# Patient Record
Sex: Male | Born: 1970 | Hispanic: Yes | Marital: Single | Smoking: Current every day smoker
Health system: Southern US, Community
[De-identification: ages and names within clinical notes are randomized; demographics above are authoritative.]

---

## 2009-03-13 ENCOUNTER — Inpatient Hospital Stay: Payer: Self-pay | Admitting: Student

## 2009-04-17 ENCOUNTER — Emergency Department: Payer: Self-pay | Admitting: Emergency Medicine

## 2009-05-01 ENCOUNTER — Emergency Department: Payer: Self-pay | Admitting: Internal Medicine

## 2009-05-17 ENCOUNTER — Emergency Department: Payer: Self-pay | Admitting: Unknown Physician Specialty

## 2009-09-29 ENCOUNTER — Emergency Department: Payer: Self-pay | Admitting: Emergency Medicine

## 2011-01-03 ENCOUNTER — Emergency Department: Payer: Self-pay | Admitting: Emergency Medicine

## 2011-05-06 IMAGING — CR DG SHOULDER 3+V*L*
1 series · 4 of 4 positions shown · non-contrast
Comparison: None

REASON FOR EXAM: pain
COMMENTS:

PROCEDURE:     DXR - DXR SHOULDER LEFT COMPLETE  - April 17, 2009  [DATE]
RESULT:     History: Pain

[Series 1: view not recorded · 0.17mm/px · 4 of 4 slices shown]
[im 1/4]
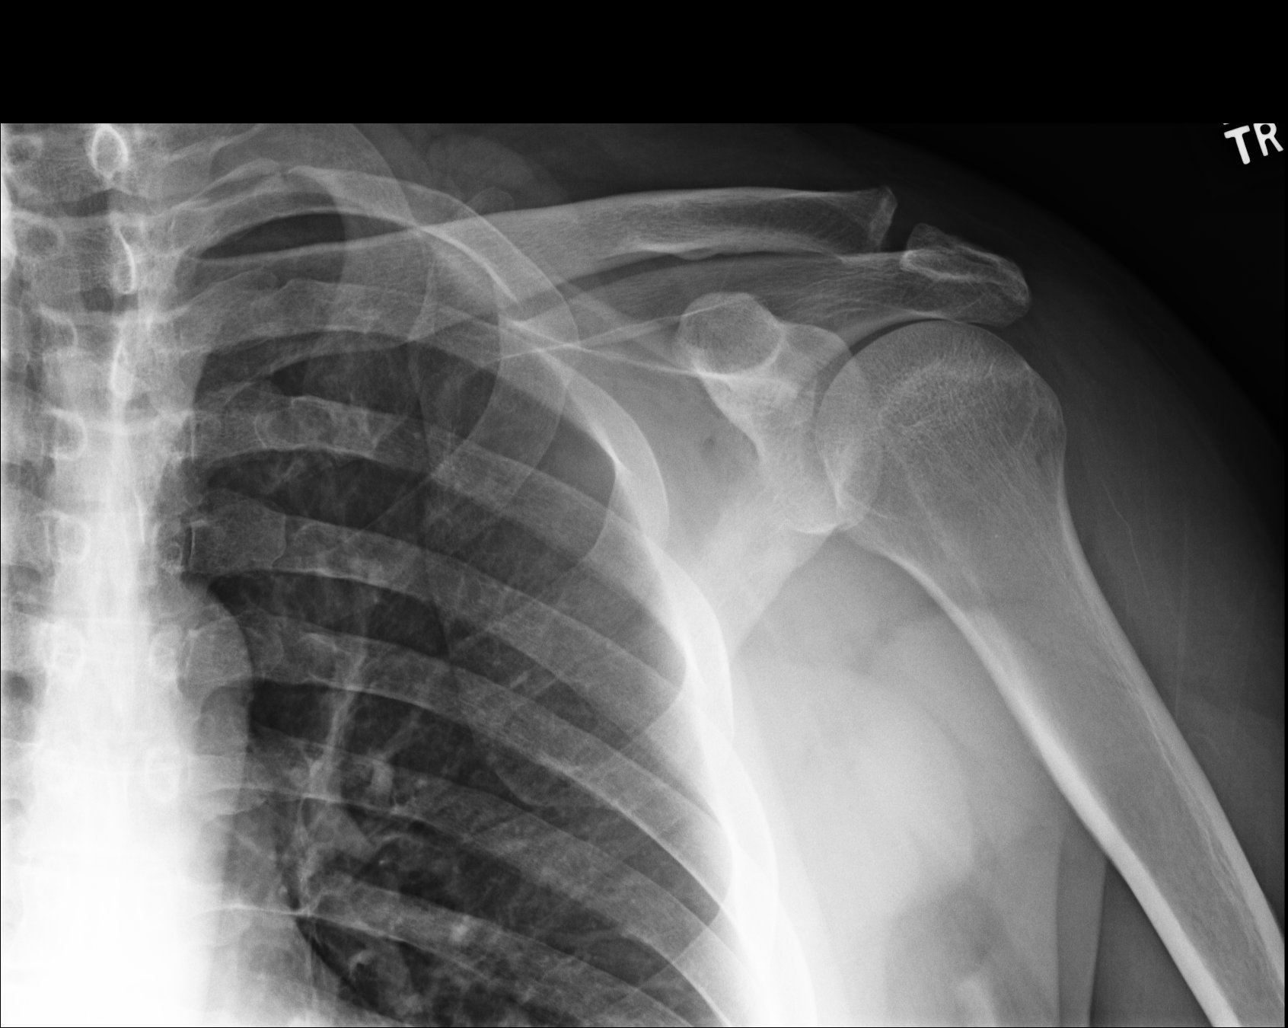
[im 2/4]
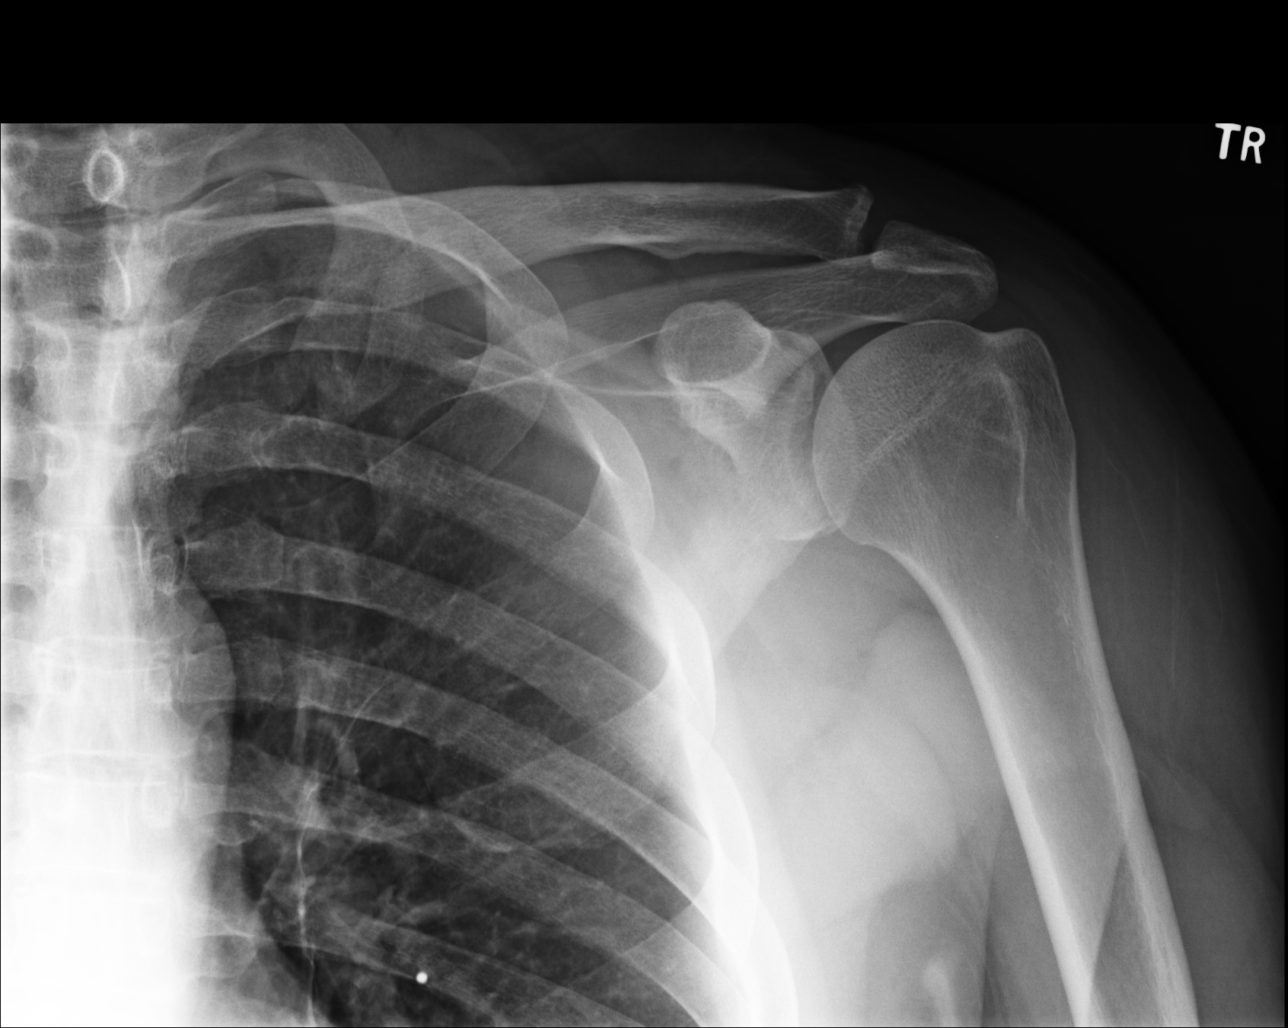
[im 3/4]
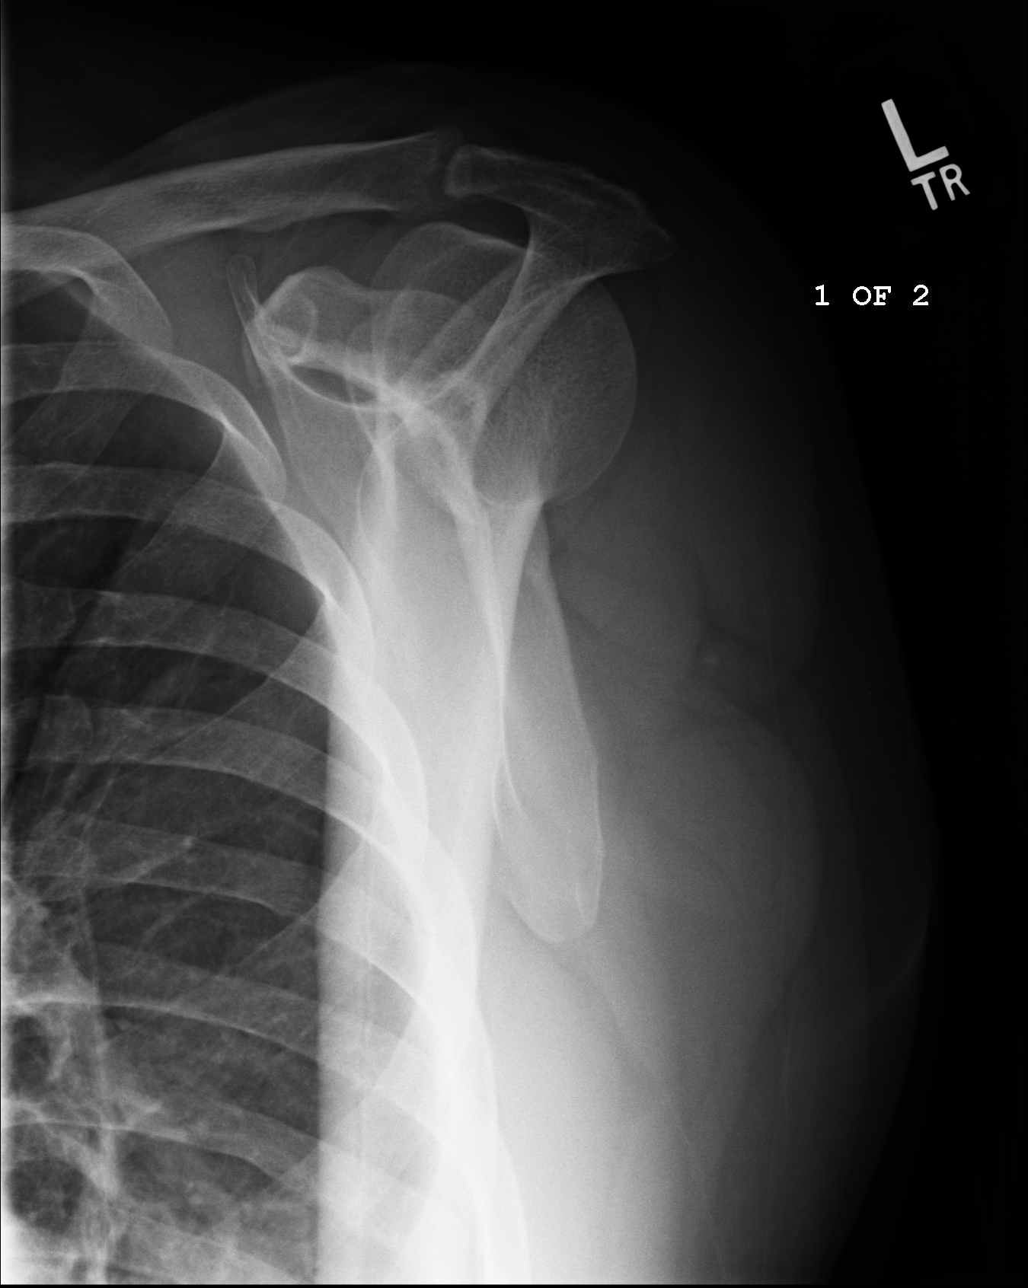
[im 4/4]
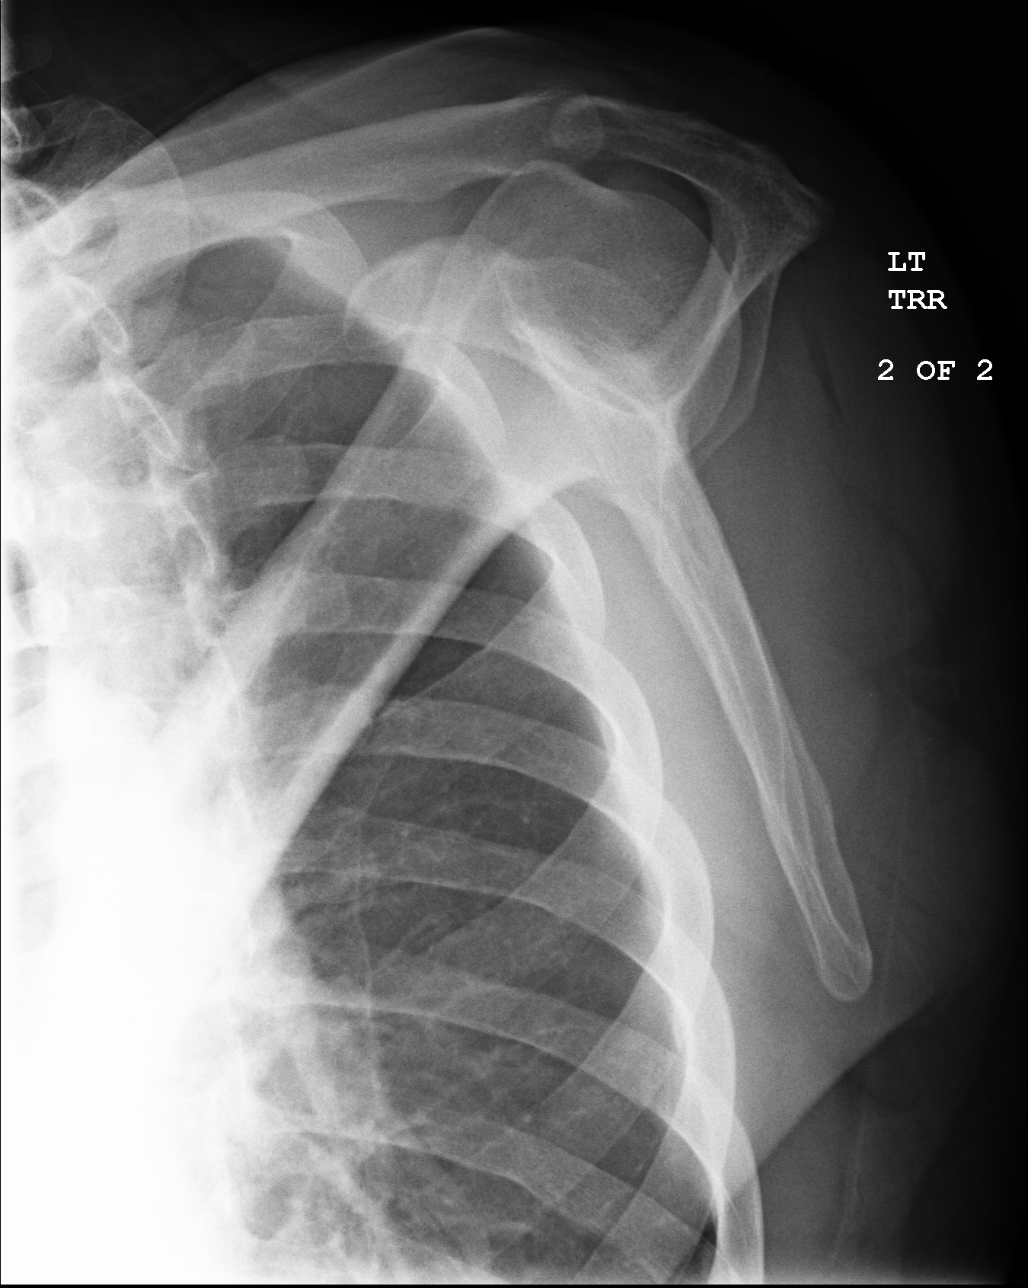

[4 of 4 positions shown; findings below may reference images not displayed]

FINDINGS: 4 views of the left shoulder demonstrates no fracture or dislocation. The
acromioclavicular joint is normal.
IMPRESSION: No acute osseous injury of the left shoulder.

## 2011-05-06 IMAGING — CR DG RIBS 2V*L*
1 series · 4 of 4 positions shown · non-contrast
Comparison: none

REASON FOR EXAM: pain
COMMENTS:

PROCEDURE:     DXR - DXR RIBS LEFT UNILATERAL  - April 17, 2009  [DATE]
RESULT:     Findings: 4 views of the left ribs demonstrates no rib fracture
or rib deformity. There is no pleural reaction.

[Series 1: view not recorded · 0.17mm/px · 4 of 4 slices shown]
[im 1/4]
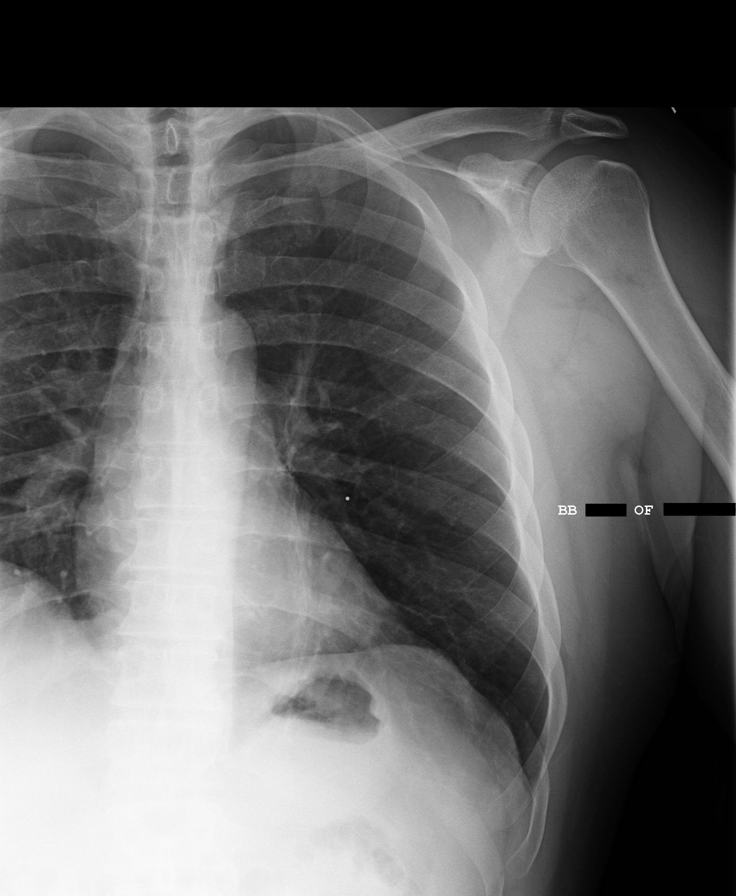
[im 2/4]
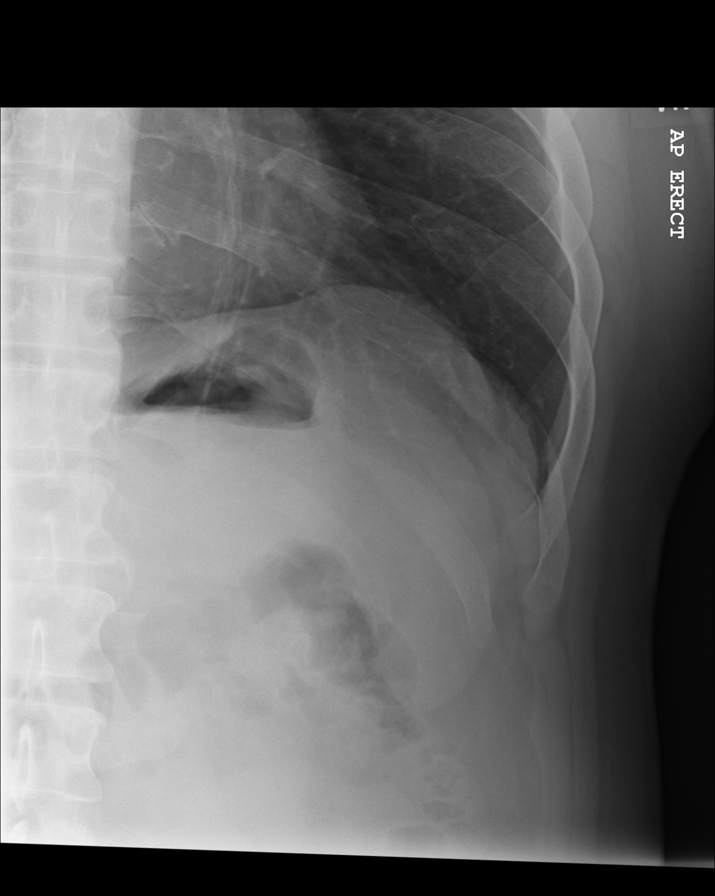
[im 3/4]
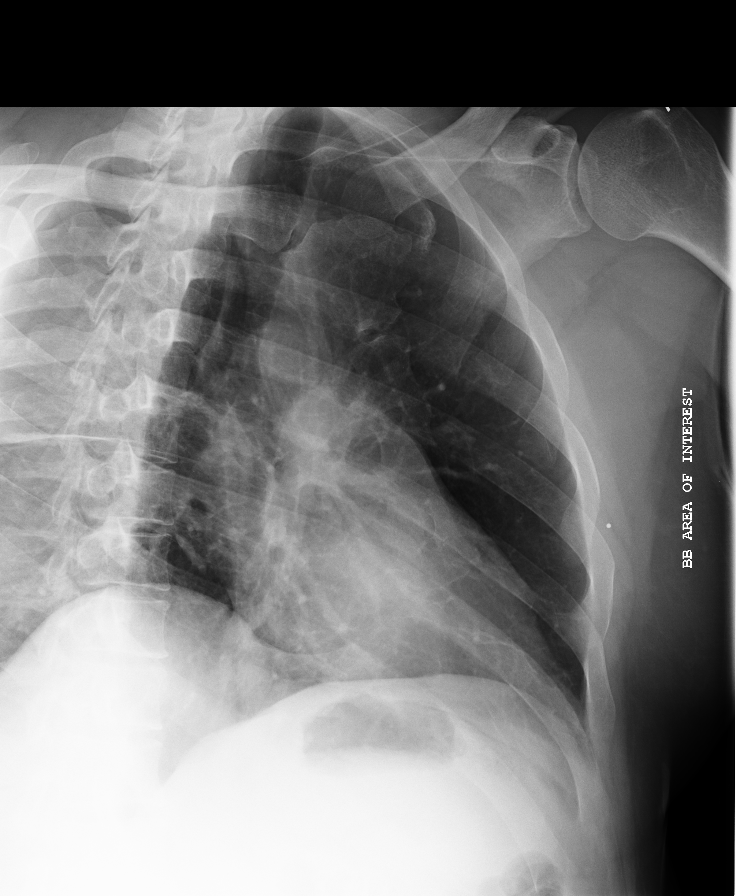
[im 4/4]
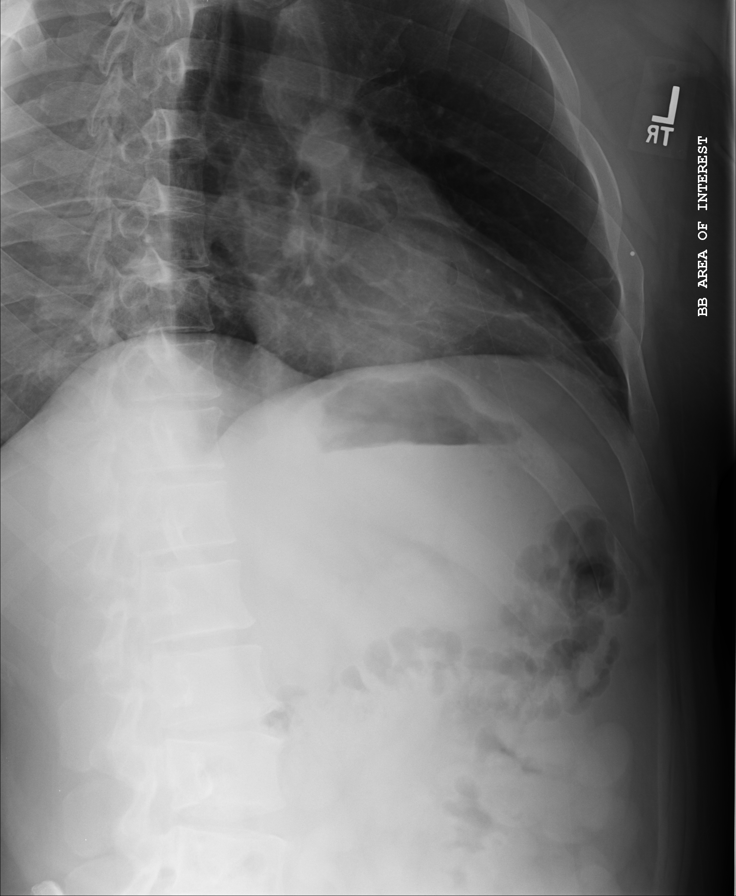

[4 of 4 positions shown; findings below may reference images not displayed]

IMPRESSION: No acute osseous injury of the left ribs.

## 2012-03-09 ENCOUNTER — Emergency Department: Payer: Self-pay | Admitting: Emergency Medicine

## 2012-04-13 ENCOUNTER — Inpatient Hospital Stay: Payer: Self-pay | Admitting: Internal Medicine

## 2012-04-13 LAB — BASIC METABOLIC PANEL
Anion Gap: 8 (ref 7–16)
Calcium, Total: 8.7 mg/dL (ref 8.5–10.1)
Co2: 26 mmol/L (ref 21–32)
EGFR (African American): >60
EGFR (Non-African Amer.): >60
Glucose: 117 mg/dL — ABNORMAL HIGH (ref 65–99)
Osmolality: 279 (ref 275–301)

## 2012-04-13 LAB — CBC
HCT: 39.6 % — ABNORMAL LOW (ref 40.0–52.0)
HGB: 13.6 g/dL (ref 13.0–18.0)
MCH: 29.8 pg (ref 26.0–34.0)
MCHC: 34.2 g/dL (ref 32.0–36.0)
MCV: 87 fL (ref 80–100)
RBC: 4.56 10*6/uL (ref 4.40–5.90)

## 2012-04-14 LAB — CBC WITH DIFFERENTIAL/PLATELET
Basophil #: 0.1 10*3/uL (ref 0.0–0.1)
Eosinophil %: 2.4 %
Lymphocyte #: 1.8 10*3/uL (ref 1.0–3.6)
Lymphocyte %: 10.3 %
MCHC: 34.4 g/dL (ref 32.0–36.0)
MCV: 87 fL (ref 80–100)
Monocyte %: 8.6 %
Neutrophil %: 77.9 %
Platelet: 210 10*3/uL (ref 150–440)
RBC: 4.1 10*6/uL — ABNORMAL LOW (ref 4.40–5.90)
RDW: 13.1 % (ref 11.5–14.5)
WBC: 17.7 10*3/uL — ABNORMAL HIGH (ref 3.8–10.6)

## 2012-04-15 LAB — CBC WITH DIFFERENTIAL/PLATELET
Basophil %: 0.2 %
Eosinophil #: 0 10*3/uL (ref 0.0–0.7)
HCT: 39.5 % — ABNORMAL LOW (ref 40.0–52.0)
HGB: 13.5 g/dL (ref 13.0–18.0)
Lymphocyte #: 1.2 10*3/uL (ref 1.0–3.6)
MCH: 29.9 pg (ref 26.0–34.0)
MCHC: 34.1 g/dL (ref 32.0–36.0)
MCV: 88 fL (ref 80–100)
Monocyte #: 0.4 x10 3/mm (ref 0.2–1.0)
Neutrophil #: 23.5 10*3/uL — ABNORMAL HIGH (ref 1.4–6.5)

## 2012-04-15 LAB — VANCOMYCIN, TROUGH: Vancomycin, Trough: 7 ug/mL — ABNORMAL LOW (ref 10–20)

## 2012-04-16 LAB — CBC WITH DIFFERENTIAL/PLATELET
Eosinophil: 1 %
HCT: 39.3 % — ABNORMAL LOW (ref 40.0–52.0)
HGB: 12.9 g/dL — ABNORMAL LOW (ref 13.0–18.0)
Lymphocytes: 10 %
MCH: 28.9 pg (ref 26.0–34.0)
MCHC: 32.9 g/dL (ref 32.0–36.0)
RBC: 4.48 10*6/uL (ref 4.40–5.90)
RDW: 13.3 % (ref 11.5–14.5)
Variant Lymphocyte - H1-Rlymph: 4 %
WBC: 23.4 10*3/uL — ABNORMAL HIGH (ref 3.8–10.6)

## 2012-04-16 LAB — CREATININE, SERUM: EGFR (African American): >60

## 2012-04-17 LAB — CBC WITH DIFFERENTIAL/PLATELET
Basophil #: 0.1 10*3/uL (ref 0.0–0.1)
Eosinophil #: 0.3 10*3/uL (ref 0.0–0.7)
HCT: 42.4 % (ref 40.0–52.0)
Lymphocyte #: 3.3 10*3/uL (ref 1.0–3.6)
Lymphocyte %: 20.1 %
MCH: 28.9 pg (ref 26.0–34.0)
MCHC: 33.2 g/dL (ref 32.0–36.0)
MCV: 87 fL (ref 80–100)
Monocyte %: 8.5 %
Neutrophil #: 11.2 10*3/uL — ABNORMAL HIGH (ref 1.4–6.5)
Neutrophil %: 68.6 %
Platelet: 336 10*3/uL (ref 150–440)
RDW: 13.3 % (ref 11.5–14.5)
WBC: 16.3 10*3/uL — ABNORMAL HIGH (ref 3.8–10.6)

## 2012-04-19 LAB — CULTURE, BLOOD (SINGLE)

## 2013-02-22 ENCOUNTER — Emergency Department: Payer: Self-pay | Admitting: Emergency Medicine

## 2013-02-22 LAB — URINALYSIS, COMPLETE
Bilirubin,UR: NEGATIVE
Blood: NEGATIVE
Leukocyte Esterase: NEGATIVE
Squamous Epithelial: <1
WBC UR: 1 /HPF (ref 0–5)

## 2013-08-29 ENCOUNTER — Emergency Department: Payer: Self-pay | Admitting: Emergency Medicine

## 2013-10-12 ENCOUNTER — Emergency Department: Payer: Self-pay | Admitting: Emergency Medicine

## 2014-09-04 ENCOUNTER — Emergency Department: Payer: Self-pay | Admitting: Emergency Medicine

## 2014-12-20 NOTE — H&P (Signed)
PATIENT NAME:  Alexander Reid, Alexander Reid MR#:  [id number] DATE OF BIRTH:  1971-05-19  DATE OF ADMISSION:  04/13/2012  PRIMARY CARE PHYSICIAN: Does not have one.   CHIEF COMPLAINT: Left lower extremity redness and swelling.   HISTORY OF PRESENT ILLNESS: This is a 44 year old male who presents to the hospital from prison due to significant left lower extremity swelling and redness. Patient noticed an open area on his left lower extremity about a few days ago. It was raised and blistering in nature. Patient was seen and evaluated by the nurse in the prison and was started on some p.o. Bactrim and some Tylenol for pain. Although over the past two days the area has progressively gotten more red and inflamed and is now going down his entire left lower extremity also into his inner thigh. He was then brought over to the Emergency Room. Patient is clinically diagnosed with left lower extremity cellulitis and having failed outpatient therapy is being admitted for IV antibiotics.   REVIEW OF SYSTEMS: CONSTITUTIONAL: Positive documented fever of 103. No weight gain. No weight loss. EYES: No blurred or double vision. ENT: No tinnitus. No postnasal drip. No redness of the oropharynx. RESPIRATORY: No cough, no wheeze, no hemoptysis, no dyspnea. CARDIOVASCULAR: No chest pain, no orthopnea, no palpitations, no syncope. GASTROINTESTINAL: No nausea, no vomiting, no diarrhea, no abdominal pain, no melena, no hematochezia. GENITOURINARY: No dysuria. No hematuria. ENDOCRINE: No polyuria or nocturia. No heat or cold intolerance. HEME: No anemia, no bruising, no bleeding. INTEGUMENTARY: No rashes. No lesions. MUSCULOSKELETAL: No arthritis, no swelling, no gout. NEUROLOGIC: No numbness, no tingling, no ataxia. No seizure-type activity. PSYCH: No anxiety, no insomnia, no ADD.   PAST MEDICAL HISTORY: He has no significant past medical history.   ALLERGIES: Ibuprofen, Tylenol and Vicodin.   SOCIAL HISTORY: No smoking. No alcohol abuse. No  illicit drug abuse.   FAMILY HISTORY: Patient's mother is still alive, has diabetes. He cannot recall any medical history about his father.   CURRENT MEDICATIONS:  1. Bactrim 1 tab b.i.d.  2. Tylenol 650 q.6 hours as needed.   PHYSICAL EXAMINATION:  VITAL SIGNS: Patient's vital signs are noted to be: Temperature 99.3, pulse 108, respirations 18, blood pressure 129/68, sats 98% on room air.   GENERAL: He is a pleasant appearing male in mild to moderate distress.   HEENT: Atraumatic, normocephalic. Extraocular muscles are intact. Pupils equal, reactive to light. Sclerae anicteric. No conjunctival injection. No pharyngeal erythema.   NECK: Supple. There is no jugular venous distention, no bruits, no lymphadenopathy, no thyromegaly.   HEART: Regular rate and rhythm. No murmurs, no rubs, no clicks.   LUNGS: Clear to auscultation bilaterally. No rales, no rhonchi, no wheezes.   ABDOMEN: Soft, flat, nontender, nondistended. Has good bowel sounds. No hepatosplenomegaly appreciated.   EXTREMITIES: No evidence of any cyanosis or clubbing. His left lower extremity there is a small area about 3 x 4 cm that is fluctuant and red and the redness and swelling is present throughout the entire left lower extremity consistent with cellulitis. +2 pedal and radial pulses. About +1 to 2 pitting edema on the left as compared to the right.   SKIN: Moist, warm with a cellulitic rash on the left lower extremity.   NEUROLOGIC: Patient is alert, awake, oriented x3 with no focal motor or sensory deficits appreciated bilaterally.   LYMPHATIC: There is no cervical or axillary lymphadenopathy.   LABORATORY, DIAGNOSTIC, AND RADIOLOGICAL DATA: Serum glucose 117, BUN 13, creatinine 0.9, sodium 139, potassium  3.8, chloride 105, bicarbonate 28, white cell count 21.3, hemoglobin 13.6, hematocrit 39.6, platelet count 234.   ASSESSMENT AND PLAN: This is a 44 year old male with no significant past medical history who  presents to the hospital from prison due to left lower extremity redness and swelling.  1. Left lower extremity cellulitis. This is the likely diagnosis given the patient's clinical presentation. Will start the patient on IV vancomycin and Zosyn. Patient apparently did say that he had a similar episode on his left upper extremity about a month to two months ago although it resolved with antibiotics. Will also continue to give him pain control with IV morphine. Continue supportive care. Will also get a surgical consult to see if the patient needs an incision and drainage given the small fluctuant area.  2. Leukocytosis. This is likely secondary to cellulitis. Will follow his white cell count with treatment with IV antibiotics.  3. CODE STATUS: Patient is a FULL CODE.   TIME SPENT ON ADMISSION: 45 minutes.   ____________________________ Rolly Pancake. Cherlynn Kaiser, MD vjs:cms D: 04/13/2012 18:36:00 ET T: 04/14/2012 07:11:48 ET JOB#: [id number]  cc: Rolly Pancake. Cherlynn Kaiser, MD, <Dictator> Houston Siren MD ELECTRONICALLY SIGNED 04/18/2012 10:48

## 2014-12-20 NOTE — Op Note (Signed)
PATIENT NAME:  Alexander StarchMAYA, Kadeen MR#:  [id number] DATE OF BIRTH:  Jun 22, 1971  DATE OF PROCEDURE:  04/14/2012  PREOPERATIVE DIAGNOSIS: Left anterior leg abscess.   POSTOPERATIVE DIAGNOSIS: Left anterior leg abscess.   PROCEDURE PERFORMED: Incision and drainage of left anterior leg abscess.   SURGEON:  Natale LayMark Drago Hammonds, MD   ASSISTANT: Wallace KellerLauren Weinberg, PA student.   TYPE OF ANESTHESIA: General with LMA.  ANESTHESIOLOGIST: Dr. [facility].   INDICATIONS: A 44 year old male presents to the hospital and was admitted to the Medical Service with a five-day history of left leg pain, cellulitis and swelling. Clinically there is a large abscess in the left anterior leg. There is extensive cellulitis. I discussed with him incision and drainage in the Operating Room. All of his questions are answered. Informed consent was obtained.   FINDINGS: There was 13 x 13 cm of cellulitis along the left anterior leg with a 6 x 7 cm subcutaneous abscess.   SPECIMENS: Pus.   ESTIMATED BLOOD LOSS: Minimal.   DRESSINGS: Iodoform gauze and ABD with tape and Kerlix.   DESCRIPTION OF PROCEDURE: With the patient in the supine position, general anesthesia was induced. The left leg was sterilely prepped and draped with ChloraPrep solution and a timeout was observed.   Utilizing an 18-gauge needle and 10 mL syringe, an aliquot of pus was aspirated and submitted for microbacteriological analysis.   An incision along the axial orientation of the limb was fashioned measuring approximately 1 cm over the area of maximal fluctuance into the abscess cavity. The abscess cavity was disrupted of its loculations with finger fracture technique, and the wound was then irrigated with sterile saline. With the abscess drained completely, it was then packed open with an inch-wide Iodoform gauze, followed by ABDs, Kerlix and tape. The patient was then returned to Anesthesia, extubated and taken to the PACU in stable and satisfactory  condition by Anesthesia Services.   ____________________________ Redge GainerMark A. Egbert GaribaldiBird, MD mab:cbb D: 04/14/2012 15:30:06 ET T: 04/14/2012 17:17:46 ET JOB#: [id number]  cc: Loraine LericheMark A. Egbert GaribaldiBird, MD, <Dictator>  Raynald KempMARK A Amador Braddy MD ELECTRONICALLY SIGNED 04/22/2012 15:03

## 2014-12-20 NOTE — Discharge Summary (Signed)
PATIENT NAME:  Alexander Reid, Iven MR#:  [id number] DATE OF BIRTH:  01-Nov-1970  DATE OF ADMISSION:  04/13/2012 DATE OF DISCHARGE:  04/17/2012  PRIMARY CARE PHYSICIAN: The patient is in the jail.  [facility]   DISCHARGE DIAGNOSES:   1. Systemic inflammatory response syndrome.  2. Abscess and cellulitis of the lower extremity.  3. Leg pain.   MEDICATIONS ON DISCHARGE:  1. Clindamycin 300 mg every 8 hours for 10 more days.  2. Oxycodone 5 mg, 1 tablet every 4 hours as needed for pain.  3. Wound packing with iodoform packing daily. Cover with abdominal pad and Kerlix wrap. The wound must remain covered while open.   DIET: Regular diet, regular consistency.   ACTIVITY: Activity as tolerated.   FOLLOWUP:  1. Follow-up with Surgery, Dr. Egbert GaribaldiBird, if needed.  2. [facility]   REASON FOR ADMISSION: The patient was admitted 04/13/2012 and discharged  04/17/2012. He came in with left lower extremity redness and swelling and was admitted with systemic inflammatory response syndrome and lower extremity abscess cellulitis, started on IV vancomycin and Zosyn. Surgical consultation initially was done by Dr. Excell Seltzerooper. The patient was taken to the Operating Room by Dr. Egbert GaribaldiBird on 04/14/2012.   HOSPITAL LABORATORY, DIAGNOSTIC AND RADIOLOGIC DATA: Blood cultures were negative for three days. First wound culture grew out rare staph aureus which was sensitive. Glucose 117, BUN 13, creatinine 0.95, sodium 139, potassium 3.8, chloride 105, CO2 26. White blood cell count 21.3, hemoglobin and hematocrit 13.6 and 39.6, platelet count 234; white count 17.7, hemoglobin and hematocrit 12.3 and 35.6, platelet count of 210. The second culture grew out heavy growth of staph aureus which was sensitive to clindamycin. White count upon discharge came down to 16.3.   The patient was afebrile, the wound was looking better, still packed, still some erythema surrounding  the drainage site.   HOSPITAL COURSE PER PROBLEM LIST:  1. Systemic inflammatory response syndrome: The patient's tachycardia had improved and now afebrile. White count was trending better down to 16,000. The abscess and cellulitis of the lower extremity was drained in the Operating Room by Dr. Egbert GaribaldiBird on 04/14/2012. It grew out staph aureus which was sensitive. The patient was switched over to clindamycin upon discharge and vancomycin was stopped.  2. Leg pain: He was given oxycodone for pain.   [facility]. The wound must be covered while in the jail until its healed. Follow-up with the doctor at the jail in 1 to 2 days for a relook. Can follow-up with Dr. Egbert GaribaldiBird as outpatient, if needed.   TIME SPENT ON DISCHARGE: 35 minutes.  ____________________________ Herschell Dimesichard J. Renae GlossWieting, MD rjw:cbb D: 04/17/2012 15:48:42 ET T: 04/18/2012 13:40:58 ET JOB#: [id number]  cc: Herschell Dimesichard J. Renae GlossWieting, MD, <Dictator> [facility] Salley ScarletICHARD J Aleister Lady MD ELECTRONICALLY SIGNED 04/29/2012 16:49

## 2014-12-20 NOTE — Consult Note (Signed)
PATIENT NAME:  Alexander Reid, Alexander Reid DATE OF BIRTH:  [birth year]  DATE OF CONSULTATION:  04/13/2012  REFERRING PHYSICIAN:   CONSULTING PHYSICIAN:  Adah Salvageichard E. Excell Seltzerooper, MD  CHIEF COMPLAINT: Left leg pain.   HISTORY OF PRESENT ILLNESS: This is a patient with several days of increasing redness and swelling of the left leg. He thinks he may have been bitten by a spider but did not see the spider and his pain and swelling emanates and radiates from a rounded area of small eschar measuring approximately 2 to 3 mm on the anterior surface of his leg on the left. He has considerable erythema as well. He has had no fevers or chills. He is currently incarcerated wearing a prison jumpsuit and shackles.   PAST MEDICAL HISTORY: None. Denies diabetes or any other medical problems.   PAST SURGICAL HISTORY: None.   ALLERGIES: Ibuprofen and Tylenol No. 3 and Vicodin.   MEDICATIONS: None in the prison or at home.   FAMILY HISTORY: Noncontributory.   SOCIAL HISTORY: He does not smoke. He is currently incarcerated.   REVIEW OF SYSTEMS: 10 system review was performed and negative with the exception of that mentioned in the history of present illness.   PHYSICAL EXAMINATION:  GENERAL: Healthy incarcerated prisoner wearing a prison jumpsuit with chains.   VITAL SIGNS: Temperature 99.3, pulse 108, respirations 18, blood pressure 129/68, 98% room air sat. Pain scale was 10 and now it is zero.   HEENT: No scleral icterus.   NECK: No palpable neck nodes.   CHEST: Clear to auscultation.   CARDIAC: Regular rate and rhythm.   ABDOMEN: Soft, nontender.   EXTREMITIES: Extremities are showing moderate edema and erythema of the left leg distal to the patella. There is a 2 mm area of eschar with blistering and fluctuance, minimally tender. The erythema stops at the ankle. No similar area on the right leg.   NEUROLOGIC: Grossly intact.   INTEGUMENT: No jaundice.   LABORATORY, DIAGNOSTIC, AND  RADIOLOGICAL DATA: White blood cell count 21.3, hemoglobin and hematocrit 13.6 and 39.6, platelet count 234. Electrolytes are within normal limits.   ASSESSMENT AND PLAN: This is a patient with left leg cellulitis and probable abscess with fluctuance anterior. The etiology of this is unclear. He is currently incarcerated and when I was interviewing him he had just finished a full meal. He requires incision and drainage of this area but because of his n.p.o. status he will need to be admitted to the hospital by Dr. Cherlynn KaiserSainani which has already been done. He has been placed on IV antibiotics. I talked to Dr. Egbert GaribaldiBird about scheduling him for tomorrow for incision and drainage of left leg abscess and he will be made n.p.o. after midnight and consented. I discussed with the patient the rationale for this plan and the risks of bleeding, infection, recurrence, and the need for an open wound and cosmetic deformity and recurrence. He understood and agreed to proceed.   ____________________________ Adah Salvageichard E. Excell Seltzerooper, MD rec:cms D: 04/13/2012 19:56:46 ET T: 04/14/2012 10:07:16 ET JOB#: [id number]  cc: Adah Salvageichard E. Excell Seltzerooper, MD, <Dictator> Lattie HawICHARD E Trentan Trippe MD ELECTRONICALLY SIGNED 04/14/2012 19:10

## 2014-12-20 NOTE — Consult Note (Signed)
Brief Consult Note: Diagnosis: left leg abscess.   Patient was seen by consultant.   Consult note dictated.   Recommend to proceed with surgery or procedure.   Orders entered.   Discussed with Attending MD.   Comments: Discussed with Drs. Sainani and Constellation BrandsBird. Needs I&D but pt just ate full meal. Will schedule for am. Risks discussed in detail.  Electronic Signatures: Lattie Hawooper, Yeiren Whitecotton E (MD)  (Signed 12-Aug-13 19:52)  Authored: Brief Consult Note   Last Updated: 12-Aug-13 19:52 by Lattie Hawooper, Cristabel Bicknell E (MD)

## 2015-07-11 ENCOUNTER — Encounter: Payer: Self-pay | Admitting: Emergency Medicine

## 2015-07-11 ENCOUNTER — Emergency Department
Admission: EM | Admit: 2015-07-11 | Discharge: 2015-07-11 | Disposition: A | Discharge status: Home / Self Care | Payer: Self-pay | Attending: Emergency Medicine | Admitting: Emergency Medicine

## 2015-07-11 ENCOUNTER — Emergency Department: Payer: Self-pay

## 2015-07-11 DIAGNOSIS — S0511XA Contusion of eyeball and orbital tissues, right eye, initial encounter: Secondary | ICD-10-CM

## 2015-07-11 DIAGNOSIS — S0011XA Contusion of right eyelid and periocular area, initial encounter: Secondary | ICD-10-CM | POA: Insufficient documentation

## 2015-07-11 DIAGNOSIS — W228XXA Striking against or struck by other objects, initial encounter: Secondary | ICD-10-CM | POA: Insufficient documentation

## 2015-07-11 DIAGNOSIS — Y9389 Activity, other specified: Secondary | ICD-10-CM | POA: Insufficient documentation

## 2015-07-11 DIAGNOSIS — Y9289 Other specified places as the place of occurrence of the external cause: Secondary | ICD-10-CM | POA: Insufficient documentation

## 2015-07-11 DIAGNOSIS — Z720 Tobacco use: Secondary | ICD-10-CM | POA: Insufficient documentation

## 2015-07-11 DIAGNOSIS — Y998 Other external cause status: Secondary | ICD-10-CM | POA: Insufficient documentation

## 2015-07-11 MED ORDER — IBUPROFEN 800 MG PO TABS
800.0000 mg | ORAL_TABLET | Freq: Once | ORAL | Status: AC
  Administered 2015-07-11: 800 mg via ORAL
  Filled 2015-07-11: qty 1

## 2015-07-11 MED ORDER — TRAMADOL HCL 50 MG PO TABS
50.0000 mg | ORAL_TABLET | Freq: Once | ORAL | Status: AC
  Administered 2015-07-11: 50 mg via ORAL
  Filled 2015-07-11: qty 1

## 2015-07-11 MED ORDER — IBUPROFEN 800 MG PO TABS: 800.0000 mg | ORAL_TABLET | Freq: Three times a day (TID) | ORAL | Status: DC | PRN

## 2015-07-11 MED ORDER — TRAMADOL HCL 50 MG PO TABS: 50.0000 mg | ORAL_TABLET | Freq: Four times a day (QID) | ORAL | Status: DC | PRN

## 2015-07-11 NOTE — ED Provider Notes (Signed)
[facility] Emergency Department Provider Note  ____________________________________________  Time seen: Approximately 5:48 PM  I have reviewed the triage vital signs and the nursing notes.   HISTORY  Chief Complaint Headache    HPI Alexander Reid. is a 44 y.o. male patient complained of headache, right periorbital pain, edema, decreased vision and ecchymosissecondary to blunt trauma 5 days ago. Patient was assisted in placing a sign when his partner lost control of it and was struck in the right periorbital area of his face. He denies any loss of consciousness. Patient state his headache is frontal. Patient states continue had blurry vision. Patient denies any vertigo. He denies any nausea vomiting secondary to his complaint. Patient rated his pain discomfort as 8/10. Patient taking only Tylenol for his complaint.   History reviewed. No pertinent past medical history.  There are no active problems to display for this patient.   History reviewed. No pertinent past surgical history.  Current Outpatient Rx  Name  Route  Sig  Dispense  Refill  . ibuprofen (ADVIL,MOTRIN) 800 MG tablet   Oral   Take 1 tablet (800 mg total) by mouth every 8 (eight) hours as needed for moderate pain.   15 tablet   0   . traMADol (ULTRAM) 50 MG tablet   Oral   Take 1 tablet (50 mg total) by mouth every 6 (six) hours as needed for moderate pain.   12 tablet   0     Allergies Review of patient's allergies indicates no known allergies.  History reviewed. No pertinent family history.  Social History Social History  Substance Use Topics  . Smoking status: Current Every Day Smoker  . Smokeless tobacco: None  . Alcohol Use: No    Review of Systems Constitutional: No fever/chills Eyes: No visual changes. Patient stated blurry vision right eye. ENT: No sore throat. Cardiovascular: Denies chest pain. Respiratory: Denies shortness of breath. Gastrointestinal: No  abdominal pain.  No nausea, no vomiting.  No diarrhea.  No constipation. Genitourinary: Negative for dysuria. Musculoskeletal: Negative for back pain. Skin: Negative for rash. Right periorbital edema and ecchymosis. Neurological: Positive for headaches, but denies focal weakness or numbness. 10-point ROS otherwise negative.  ____________________________________________   PHYSICAL EXAM:  VITAL SIGNS: ED Triage Vitals  Enc Vitals Group     BP 07/11/15 1739 125/83 mmHg     Pulse Rate 07/11/15 1739 76     Resp 07/11/15 1739 17     Temp 07/11/15 1739 98.1 F (36.7 C)     Temp Source 07/11/15 1739 Oral     SpO2 07/11/15 1739 95 %     Weight 07/11/15 1739 195 lb (88.451 kg)     Height 07/11/15 1739  (1.651 m)     Head Cir --      Peak Flow --      Pain Score 07/11/15 1725 8     Pain Loc --      Pain Edu? --      Excl. in GC? --     Constitutional: Alert and oriented. Well appearing and in no acute distress. Eyes: Conjunctivae are normal. PERRL. EOMI. Head: Atraumatic. Nose: No congestion/rhinnorhea. Mouth/Throat: Mucous membranes are moist.  Oropharynx non-erythematous. Neck: No stridor.  No cervical spine tenderness to palpation. Hematological/Lymphatic/Immunilogical: No cervical lymphadenopathy. Cardiovascular: Normal rate, regular rhythm. Grossly normal heart sounds.  Good peripheral circulation. Respiratory: Normal respiratory effort.  No retractions. Lungs CTAB. Gastrointestinal: Soft and nontender. No distention. No abdominal bruits. No  CVA tenderness. Musculoskeletal: No lower extremity tenderness nor edema.  No joint effusions. Neurologic:  Normal speech and language. No gross focal neurologic deficits are appreciated. No gait instability. Skin:  Skin is warm, dry and intact. No rash noted. Right upper orbital edema and ecchymosis. Psychiatric: Mood and affect are normal. Speech and behavior are normal.  ____________________________________________   LABS (all  labs ordered are listed, but only abnormal results are displayed)  Labs Reviewed - No data to display ____________________________________________  EKG   ____________________________________________  RADIOLOGY facial CT shows no obvious fracture. ____________________________________________   PROCEDURES  Procedure(s) performed: None  Critical Care performed: No  ____________________________________________   INITIAL IMPRESSION / ASSESSMENT AND PLAN / ED COURSE  Pertinent labs & imaging results that were available during my care of the patient were reviewed by me and considered in my medical decision making (see chart for details).  Right periorbital contusion. Discussed CT findings with patient. Advised patient to continue light duty and take Tylenol for headache and pain. ____________________________________________   FINAL CLINICAL IMPRESSION(S) / ED DIAGNOSES  Final diagnoses:  Periorbital contusion, right, initial encounter      Joni ReiningRonald K Smith, PA-C 07/11/15 1834  Sharman CheekPhillip Stafford, MD 07/15/15 [phone]

## 2015-07-11 NOTE — ED Notes (Signed)
Pt to ed with c/o headache since head being hit with a sign at work on Thursday.  Pt with bruising noted to pt right eye.

## 2015-07-11 NOTE — Discharge Instructions (Signed)
Advised to continue taking Tylenol for headache

## 2017-07-29 IMAGING — CT CT MAXILLOFACIAL W/O CM
1 series · 16 of 30 positions shown, 20 images · non-contrast
Comparison: None.

CLINICAL DATA: Headache after being hit with sign at work 5 days
ago. Bruising to right eye.

EXAM:
CT MAXILLOFACIAL WITHOUT CONTRAST
TECHNIQUE: Multidetector CT imaging of the maxillofacial structures was
performed. Multiplanar CT image reconstructions were also generated.
A small metallic BB was placed on the right temple in order to
reliably differentiate right from left.

[Series 2: max soft · axial · 0.37mm/px · z∈[+231,+387]mm · 16 of 84 slices shown, 20 images]
[im 3/84  brain]
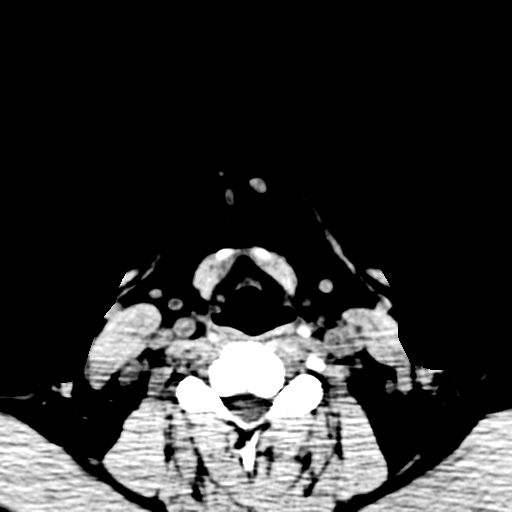
[im 3/84  bone]
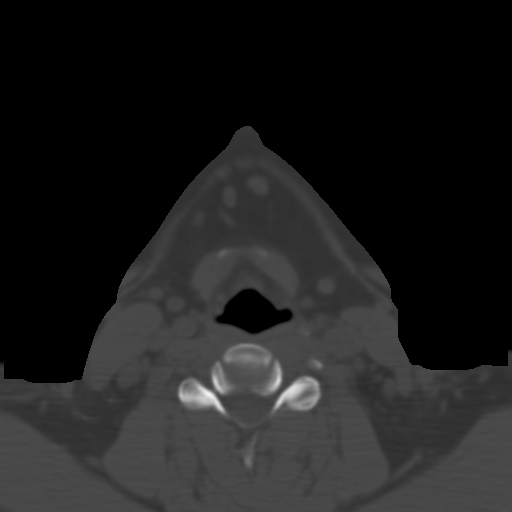
[im 9/84  bone]
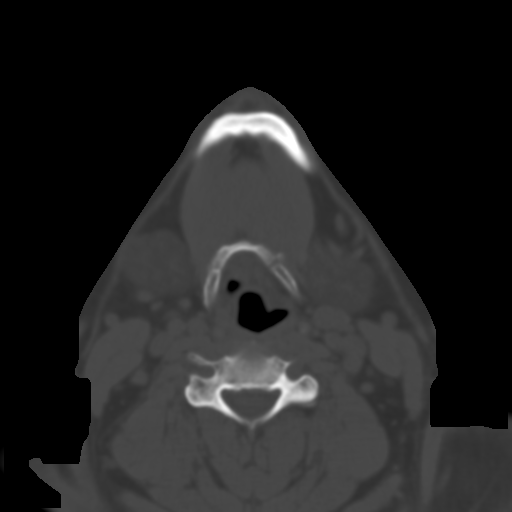
[im 15/84  bone]
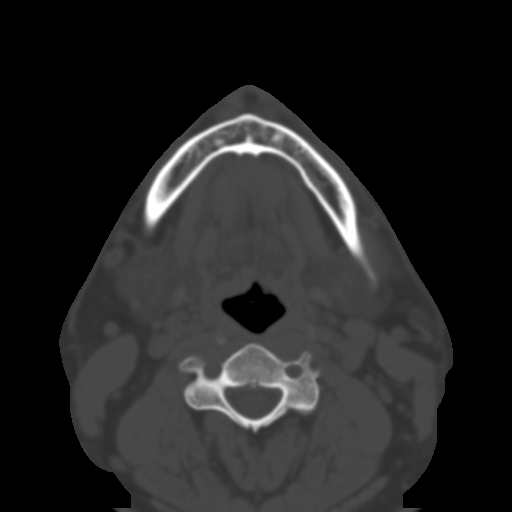
[im 21/84  bone]
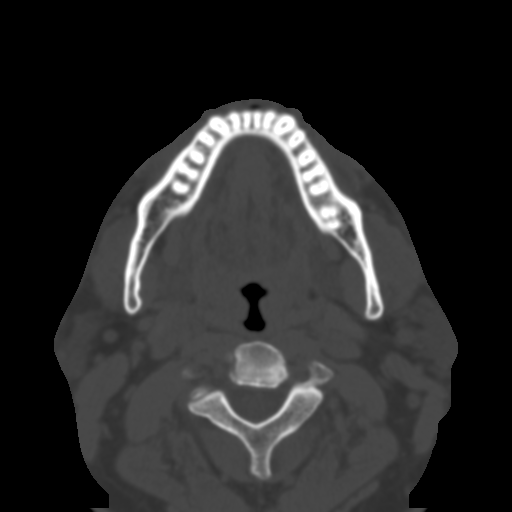
[im 23/84  brain]
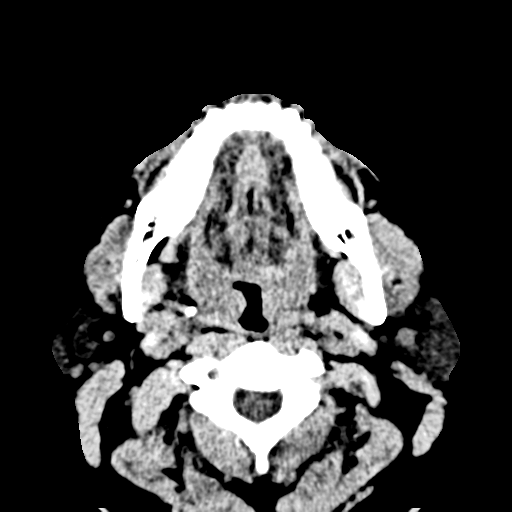
[im 23/84  bone]
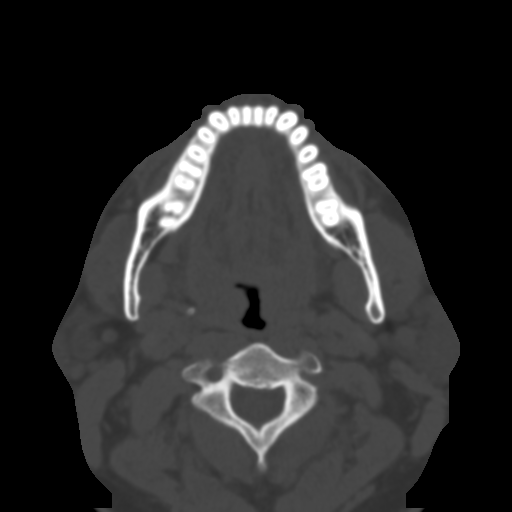
[im 29/84  bone]
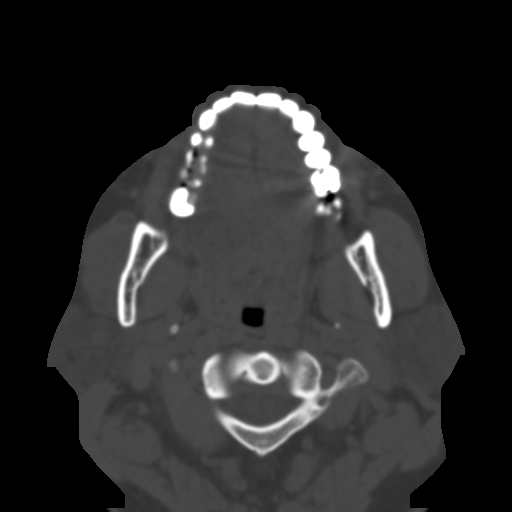
[im 35/84  bone]
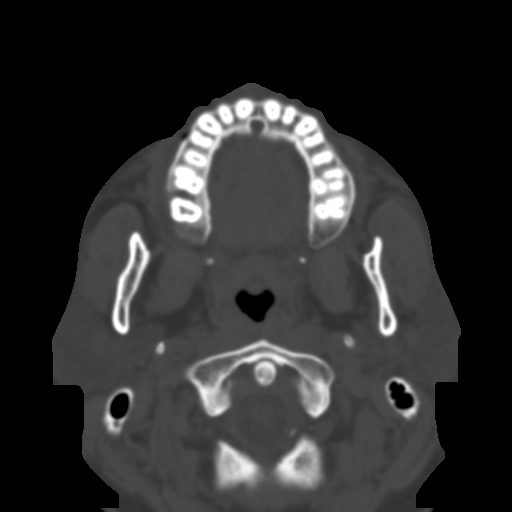
[im 41/84  bone]
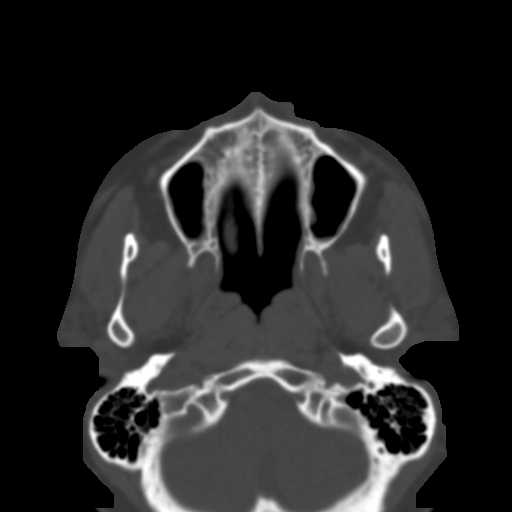
[im 43/84  brain]
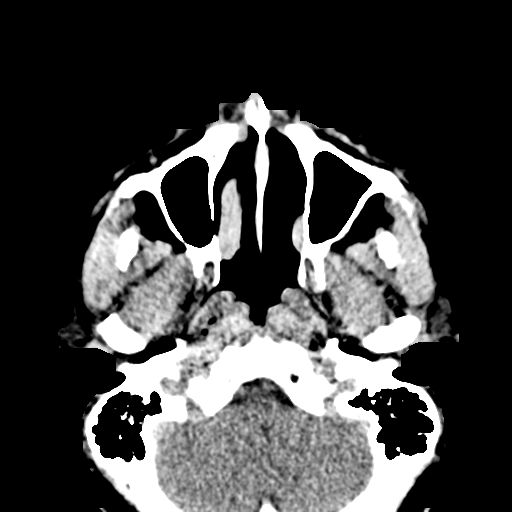
[im 43/84  bone]
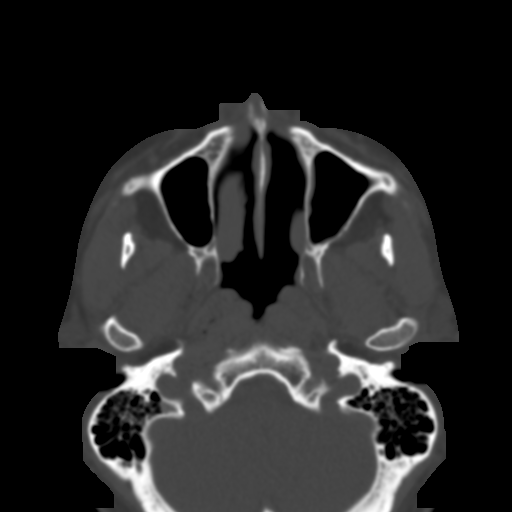
[im 49/84  bone]
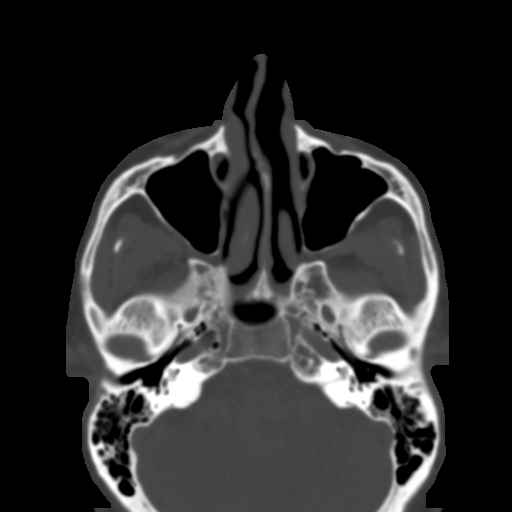
[im 55/84  bone]
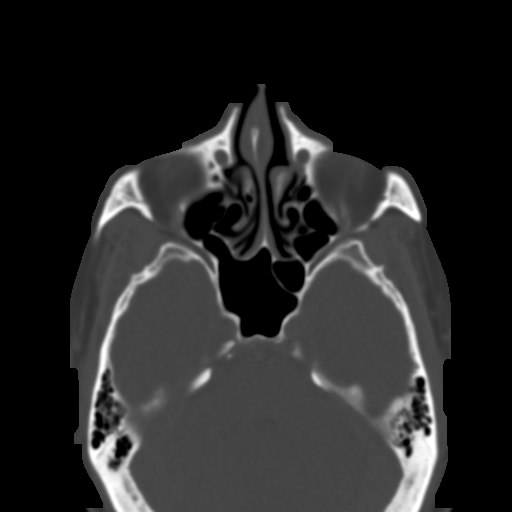
[im 61/84  bone]
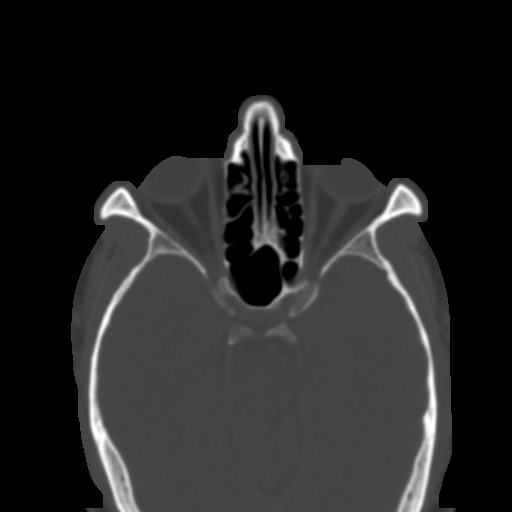
[im 63/84  brain]
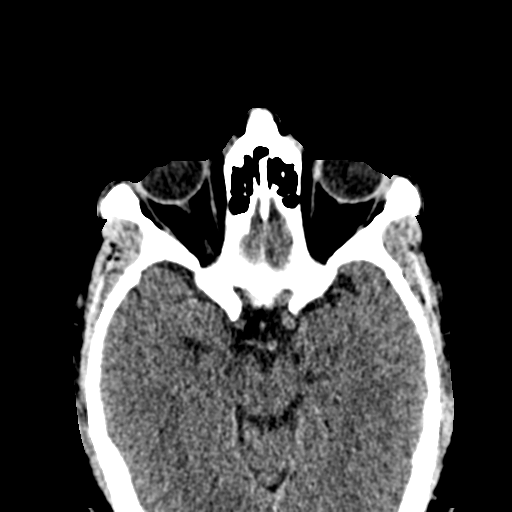
[im 63/84  bone]
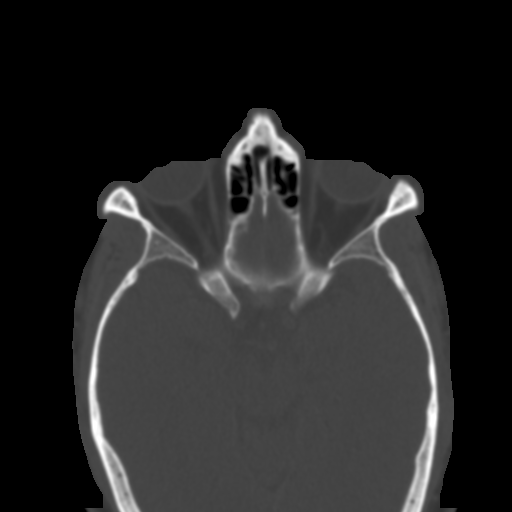
[im 69/84  bone]
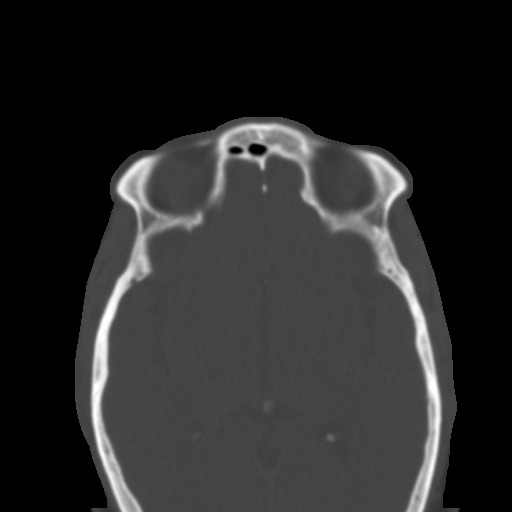
[im 75/84  bone]
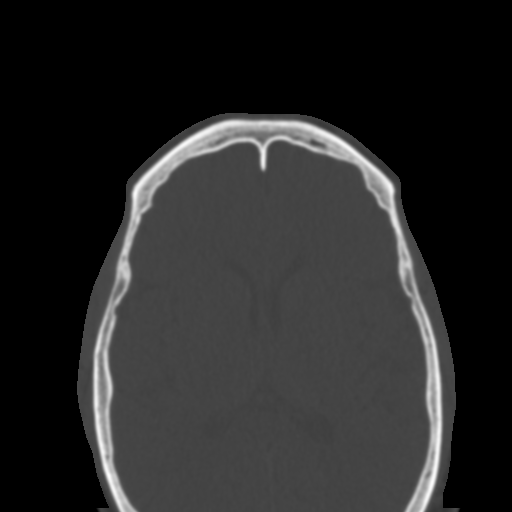
[im 81/84  bone]
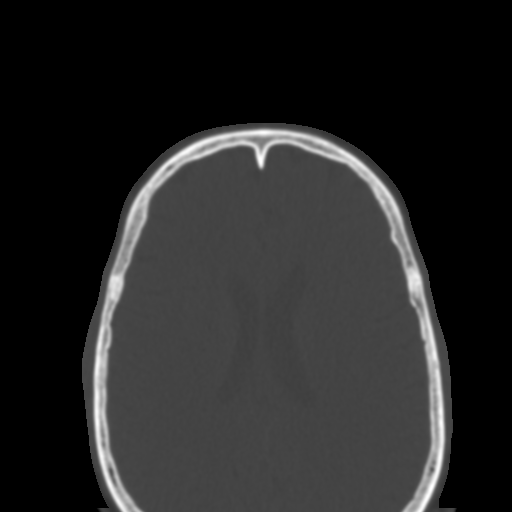

[16 of 30 positions shown; findings below may reference images not displayed]

FINDINGS: Visualized intracranial structures are normal. Orbits are normal and
symmetric. Paranasal sinuses are well developed and well aerated
without air-fluid levels. Mastoid air cells are clear. There is
minimal depression of the distal aspect of the nasal bone which may
be within normal versus subtle fracture. Otherwise, no evidence of
facial bone fracture. Few small calcifications over the
peritonsillar soft tissues bilaterally. Remaining bones and soft
tissues are within normal.
IMPRESSION: Subtle depression of the distal nasal bone which may be within
normal versus subtle fracture. Otherwise, no acute findings.

## 2018-10-08 ENCOUNTER — Encounter: Payer: Self-pay | Admitting: Emergency Medicine

## 2018-10-08 ENCOUNTER — Other Ambulatory Visit: Payer: Self-pay

## 2018-10-08 ENCOUNTER — Emergency Department
Admission: EM | Admit: 2018-10-08 | Discharge: 2018-10-08 | Disposition: A | Discharge status: Home / Self Care | Payer: Self-pay | Attending: Emergency Medicine | Admitting: Emergency Medicine

## 2018-10-08 DIAGNOSIS — K0889 Other specified disorders of teeth and supporting structures: Secondary | ICD-10-CM | POA: Insufficient documentation

## 2018-10-08 DIAGNOSIS — F17200 Nicotine dependence, unspecified, uncomplicated: Secondary | ICD-10-CM | POA: Insufficient documentation

## 2018-10-08 MED ORDER — AMOXICILLIN 500 MG PO CAPS: 500.0000 mg | ORAL_CAPSULE | Freq: Three times a day (TID) | ORAL | 0 refills | Status: DC

## 2018-10-08 NOTE — ED Triage Notes (Signed)
Pt states tooth pulled 2 weeks ago and has been having pain and was told to come and be checked and was told ok but pt wants checked out

## 2018-10-08 NOTE — Discharge Instructions (Addendum)
Follow up with [facility].  If you are unable to go to this clinic then call one of the following:  OPTIONS FOR DENTAL FOLLOW UP CARE  [place] Department of Health and Human Services - Local Safety Net Dental Clinics [email or web address]   [facility] [phone])  [facility] [phone])  [place] [phone] ext [phone])  [facility] [phone])  [facility] [phone]) This clinic caters to the indigent population and is on a lottery system. Location: [employer] [facility], [employer], [address], [place] Clinic Hours: Wednesdays from 6pm - 9pm, patients seen by a lottery system. For dates, call or go to [email or web address] Services: Cleanings, fillings and simple extractions. Payment Options: DENTAL WORK IS FREE OF CHARGE. Bring proof of income or support. Best way to get seen: Arrive at 5:15 pm - this is a lottery, NOT first come/first serve, so arriving earlier will not increase your chances of being seen.     [facility] [phone] Select option 1 for emergencies   Location: [facility], [place], [address] [place] Clinic Hours: No walk-ins accepted - call the day before to schedule an appointment. Check in times are 9:30 am and 1:30 pm. Services: Simple extractions, temporary fillings, pulpectomy/pulp debridement, uncomplicated abscess drainage. Payment Options: PAYMENT IS DUE AT THE TIME OF SERVICE.  Fee is usually $100-200, additional surgical procedures (e.g. abscess drainage) may be extra. Cash, checks, Visa/MasterCard accepted.  Can file Medicaid if patient is covered for dental - patient should call case worker to check. No discount for [facility] patients. Best way to get seen: MUST call the day before and get onto the schedule. Can  usually be seen the next 1-2 days. No walk-ins accepted.     [facility] [phone]   Location: [facility], [address] [place] Clinic Hours: M, W, Th, F 8am or 1:30pm, Tues 9a or 1:30 - first come/first served. Services: Simple extractions, temporary fillings, uncomplicated abscess drainage.  You do not need to be an [facility] resident. Payment Options: PAYMENT IS DUE AT THE TIME OF SERVICE. Dental insurance, otherwise sliding scale - bring proof of income or support. Depending on income and treatment needed, cost is usually $50-200. Best way to get seen: Arrive early as it is first come/first served.     [facility] [phone]   Location: [address] Clinic Hours: Mon-Thu 8a-5p Services: Most basic dental services including extractions and fillings. Payment Options: PAYMENT IS DUE AT THE TIME OF SERVICE. Sliding scale, up to 50% off - bring proof if income or support. Medicaid with dental option accepted. Best way to get seen: Call to schedule an appointment, can usually be seen within 2 weeks OR they will try to see walk-ins - show up at 8a or 2p (you may have to wait).     [facility] [phone] [place] RESIDENTS ONLY   Location: [facility], [address], [place], [place] [address] Clinic Hours: By appointment only. Monday - Thursday 8am-5pm, Friday 8am-12pm Services: Cleanings, fillings, extractions. Payment Options: PAYMENT IS DUE AT THE TIME OF SERVICE. Cash, Visa or MasterCard. Sliding scale - $30 minimum per service. Best way to get seen: Come in to office, complete packet and make an appointment - need proof of income or support monies for each household member and proof of [facility] residence.  Usually takes about a month to get in.     [facility] [phone]   Location: [address] [facility] Clinic Hours: Walk-in Urgent Care Dental Services are offered Monday-Friday mornings only. The numbers of emergencies accepted daily is limited to the number of providers available. Maximum 15 - Mondays, Wednesdays & Thursdays Maximum 10 - Tuesdays & Fridays Services: You do not need to be a [facility] resident to be seen for a dental emergency. Emergencies are defined as pain, swelling, abnormal bleeding, or dental trauma. Walkins will receive x-rays if needed. NOTE: Dental cleaning is not an emergency. Payment Options: PAYMENT IS DUE AT THE TIME OF SERVICE. Minimum co-pay is $40.00 for uninsured patients. Minimum co-pay is $3.00 for Medicaid with dental coverage. Dental Insurance is accepted and must be presented at time of visit. Medicare does not cover dental. Forms of payment: Cash, credit card, checks. Best way to get seen: If not previously registered with the clinic, walk-in dental registration begins at 7:15 am and is on a first come/first serve basis. If previously registered with the clinic, call to make an appointment.     [facility] [phone] [place] RESIDENTS ONLY   Location: [address], [place], [place] Clinic Hours: Mon-Thu 10a-2p Services: Extractions only! Payment Options: FREE (donations accepted) - bring proof of income or support Best way to get seen: Call and schedule an appointment OR come at 8am on the 1st Monday of every month (except for holidays) when it is first come/first served.     [facility] [phone]   Location: [address] [place], [place] Clinic Hours: Friday mornings Services, Payment Options, Best way to get seen: Call for info

## 2018-10-08 NOTE — ED Provider Notes (Signed)
[facility] Emergency Department Provider Note  ____________________________________________   First MD Initiated Contact with Patient 10/08/18 1627     (approximate)  I have reviewed the triage vital signs and the nursing notes.   HISTORY  Chief Complaint Dental Pain    HPI Alexander Reid. is a 48 y.o. male presents emergency department complaining of fever and body aches.  He states he had a fever in his to sleep.  Still hurts.  He states he had a tooth pulled 2 weeks ago.  Has been having pain since then.  Thinks broke tooth still there.  He states he went to some kind that works out of a trailer on [address].  Is not sure the dentist name.  I asked him as he is sure this was a Arboriculturist and he said he was not really sure you baby guy $200 to pull his tooth.  He denies  any chest pain or shortness of breath.   History reviewed. No pertinent past medical history.  There are no active problems to display for this patient.   History reviewed. No pertinent surgical history.  Prior to Admission medications   Medication Sig Start Date End Date Taking? Authorizing Provider  amoxicillin (AMOXIL) 500 MG capsule Take 1 capsule (500 mg total) by mouth 3 (three) times daily. 10/08/18   Faythe Ghee, PA-C    Allergies Patient has no known allergies.  History reviewed. No pertinent family history.  Social History Social History   Tobacco Use  . Smoking status: Current Every Day Smoker  . Smokeless tobacco: Never Used  Substance Use Topics  . Alcohol use: No  . Drug use: No    Review of Systems  Constitutional: Positive fever/chills Eyes: No visual changes. ENT: No sore throat.  Positive dental pain Respiratory: Denies cough Genitourinary: Negative for dysuria. Musculoskeletal: Negative for back pain. Skin: Negative for rash.    ____________________________________________   PHYSICAL EXAM:  VITAL SIGNS: ED Triage Vitals  Enc  Vitals Group     BP 10/08/18 1607 120/73     Pulse Rate 10/08/18 1607 78     Resp 10/08/18 1607 16     Temp 10/08/18 1607 98 F (36.7 C)     Temp src --      SpO2 10/08/18 1607 98 %     Weight 10/08/18 1608 185 lb (83.9 kg)     Height 10/08/18 1608 5\' 5"  (1.651 m)     Head Circumference --      Peak Flow --      Pain Score 10/08/18 1608 8     Pain Loc --      Pain Edu? --      Excl. in GC? --     Constitutional: Alert and oriented. Well appearing and in no acute distress. Eyes: Conjunctivae are normal.  Head: Atraumatic. Nose: No congestion/rhinnorhea. Mouth/Throat: Mucous membranes are moist.  Positive for recently pulled tooth noted on the left upper aspect.  Questionable remaining parts of the tooth are noted. Neck:  supple no lymphadenopathy noted Cardiovascular: Normal rate, regular rhythm. Heart sounds are normal Respiratory: Normal respiratory effort.  No retractions, lungs c t a  GU: deferred Musculoskeletal: FROM all extremities, warm and well perfused Neurologic:  Normal speech and language.  Skin:  Skin is warm, dry and intact. No rash noted. Psychiatric: Mood and affect are normal. Speech and behavior are normal.  ____________________________________________   LABS (all labs ordered are listed, but  only abnormal results are displayed)  Labs Reviewed - No data to display ____________________________________________   ____________________________________________  RADIOLOGY    ____________________________________________   PROCEDURES  Procedure(s) performed: No  Procedures    ____________________________________________   INITIAL IMPRESSION / ASSESSMENT AND PLAN / ED COURSE  Pertinent labs & imaging results that were available during my care of the patient were reviewed by me and considered in my medical decision making (see chart for details).   Patient is 48 year old male presents emergency department complaining of fever and continued  dental pain after having tooth pulled.  Physical exam shows that the area of the extraction is red and swollen with questionable parts of a tooth.  Explained findings to the patient.  He was given prescription for amoxicillin.  He is to follow-up with [facility] dental clinic for evaluation.  He states he understands will comply.  He is to return if worsening.  He was discharged in stable condition.     As part of my medical decision making, I reviewed the following data within the electronic MEDICAL RECORD NUMBER Nursing notes reviewed and incorporated, Old chart reviewed, Notes from prior ED visits and [place] Controlled Substance Database  ____________________________________________   FINAL CLINICAL IMPRESSION(S) / ED DIAGNOSES  Final diagnoses:  Pain, dental      NEW MEDICATIONS STARTED DURING THIS VISIT:  New Prescriptions   AMOXICILLIN (AMOXIL) 500 MG CAPSULE    Take 1 capsule (500 mg total) by mouth 3 (three) times daily.     Note:  This document was prepared using Dragon voice recognition software and may include unintentional dictation errors.    Faythe Ghee, PA-C 10/08/18 1911    Dionne Bucy, MD 10/08/18 [phone]

## 2018-10-08 NOTE — ED Notes (Signed)
See triage note  Presents intermittent fever and body aches  States he had a tooth pulled about 2 weeks ago  States he is having headache along with fever    States fever at home was 102 but is afebrile on arrival

## 2018-11-16 ENCOUNTER — Encounter: Payer: Self-pay | Admitting: *Deleted

## 2018-11-16 ENCOUNTER — Emergency Department
Admission: EM | Admit: 2018-11-16 | Discharge: 2018-11-16 | Disposition: A | Discharge status: Home / Self Care | Payer: Self-pay | Attending: Emergency Medicine | Admitting: Emergency Medicine

## 2018-11-16 ENCOUNTER — Other Ambulatory Visit: Payer: Self-pay

## 2018-11-16 DIAGNOSIS — J111 Influenza due to unidentified influenza virus with other respiratory manifestations: Secondary | ICD-10-CM | POA: Insufficient documentation

## 2018-11-16 DIAGNOSIS — R69 Illness, unspecified: Secondary | ICD-10-CM

## 2018-11-16 DIAGNOSIS — F17200 Nicotine dependence, unspecified, uncomplicated: Secondary | ICD-10-CM | POA: Insufficient documentation

## 2018-11-16 MED ORDER — PROMETHAZINE-DM 6.25-15 MG/5ML PO SYRP: 5.0000 mL | ORAL_SOLUTION | Freq: Four times a day (QID) | ORAL | 0 refills | Status: DC | PRN

## 2018-11-16 MED ORDER — IBUPROFEN 600 MG PO TABS: 600.0000 mg | ORAL_TABLET | Freq: Three times a day (TID) | ORAL | 0 refills | Status: DC | PRN

## 2018-11-16 NOTE — ED Triage Notes (Signed)
Says aching back, head, feels weak and fever on and off for 6 days.

## 2018-11-16 NOTE — ED Provider Notes (Signed)
[facility] Emergency Department Provider Note   ____________________________________________   First MD Initiated Contact with Patient 11/16/18 1324     (approximate)  I have reviewed the triage vital signs and the nursing notes.   HISTORY  Chief Complaint Influenza    HPI Alexander Tester. is a 48 y.o. male patient presents with headache and body for 6 days.  Patient also state nasal congestion but denies cough.  Patient state unsure of fever but feels weak.  Patient rates his pain as a 10/10.  Patient describes pain as" generalized aching".  Patient started has not taken flu shot for this season.  Patient requests test for coronavirus 19.  Patient denies recent travel.  Patient denies any contact with anyone with history of recent travel.  No palliative measures for complaint.     History reviewed. No pertinent past medical history.  There are no active problems to display for this patient.   History reviewed. No pertinent surgical history.  Prior to Admission medications   Medication Sig Start Date End Date Taking? Authorizing Provider  ibuprofen (ADVIL,MOTRIN) 600 MG tablet Take 1 tablet (600 mg total) by mouth every 8 (eight) hours as needed. 11/16/18   Joni Reining, PA-C  promethazine-dextromethorphan (PROMETHAZINE-DM) 6.25-15 MG/5ML syrup Take 5 mLs by mouth 4 (four) times daily as needed for cough. 11/16/18   Joni Reining, PA-C    Allergies Patient has no known allergies.  No family history on file.  Social History Social History   Tobacco Use  . Smoking status: Current Every Day Smoker  . Smokeless tobacco: Never Used  Substance Use Topics  . Alcohol use: No  . Drug use: No    Review of Systems  Constitutional: No fever/chills.  Body aches. Eyes: No visual changes. ENT: Nasal congestion. Cardiovascular: Denies chest pain. Respiratory: Denies shortness of breath.  Nonproductive cough. Gastrointestinal: No abdominal  pain.  No nausea, no vomiting.  No diarrhea.  No constipation. Genitourinary: Negative for dysuria. Musculoskeletal: Negative for back pain. Skin: Negative for rash. Neurological: Negative for headaches, focal weakness or numbness.   ____________________________________________   PHYSICAL EXAM:  VITAL SIGNS: ED Triage Vitals [11/16/18 1319]  Enc Vitals Group     BP (!) 129/92     Pulse Rate 65     Resp 16     Temp 99.2 F (37.3 C)     Temp Source Oral     SpO2 98 %     Weight 189 lb (85.7 kg)     Height      Head Circumference      Peak Flow      Pain Score 10     Pain Loc      Pain Edu?      Excl. in GC?     Constitutional: Alert and oriented. Well appearing and in no acute distress. Nose: Clear rhinorrhea. Mouth/Throat: Mucous membranes are moist.  Oropharynx non-erythematous.  Postnasal drainage. Neck: No stridor.   Hematological/Lymphatic/Immunilogical: No cervical lymphadenopathy. Cardiovascular: Normal rate, regular rhythm. Grossly normal heart sounds.  Good peripheral circulation. Respiratory: Normal respiratory effort.  No retractions. Lungs CTAB. Gastrointestinal: Soft and nontender. No distention. No abdominal bruits. No CVA tenderness. Musculoskeletal: No lower extremity tenderness nor edema.  No joint effusions. Neurologic:  Normal speech and language. No gross focal neurologic deficits are appreciated. No gait instability. Skin:  Skin is warm, dry and intact. No rash noted. Psychiatric: Mood and affect are normal. Speech and behavior are  normal.  ____________________________________________   LABS (all labs ordered are listed, but only abnormal results are displayed)  Labs Reviewed - No data to display ____________________________________________  EKG   ____________________________________________  RADIOLOGY  ED MD interpretation:    Official radiology report(s): No results found.  ____________________________________________    PROCEDURES  Procedure(s) performed (including Critical Care):  Procedures   ____________________________________________   INITIAL IMPRESSION / ASSESSMENT AND PLAN / ED COURSE  As part of my medical decision making, I reviewed the following data within the electronic MEDICAL RECORD NUMBER         Patient presents with headache, body aches, nasal congestion, and cough for 6 days.  Patient physical exam is consistent with viral respiratory infection.  Patient given discharge care instruction advised take medication as directed.  Discussed with patient that he did not meet the criteria for coronavirus testing.     ____________________________________________   FINAL CLINICAL IMPRESSION(S) / ED DIAGNOSES  Final diagnoses:  Influenza-like illness     ED Discharge Orders         Ordered    promethazine-dextromethorphan (PROMETHAZINE-DM) 6.25-15 MG/5ML syrup  4 times daily PRN     11/16/18 1450    ibuprofen (ADVIL,MOTRIN) 600 MG tablet  Every 8 hours PRN     11/16/18 1450           Note:  This document was prepared using Dragon voice recognition software and may include unintentional dictation errors.    Joni Reining, PA-C 11/16/18 1459    Nita Sickle, MD 11/17/18 1520

## 2018-11-16 NOTE — ED Triage Notes (Signed)
Pt is here for Headache and body aches for 6 days.  Pt has mask on.  Neck supple, no travel out of country, reports mild congestion, no cough.

## 2019-12-13 ENCOUNTER — Emergency Department
Admission: EM | Admit: 2019-12-13 | Discharge: 2019-12-13 | Disposition: A | Discharge status: Home / Self Care | Payer: Self-pay | Attending: Emergency Medicine | Admitting: Emergency Medicine

## 2019-12-13 ENCOUNTER — Encounter: Payer: Self-pay | Admitting: Emergency Medicine

## 2019-12-13 ENCOUNTER — Other Ambulatory Visit: Payer: Self-pay

## 2019-12-13 DIAGNOSIS — Y999 Unspecified external cause status: Secondary | ICD-10-CM | POA: Insufficient documentation

## 2019-12-13 DIAGNOSIS — S51851A Open bite of right forearm, initial encounter: Secondary | ICD-10-CM | POA: Insufficient documentation

## 2019-12-13 DIAGNOSIS — Z23 Encounter for immunization: Secondary | ICD-10-CM | POA: Insufficient documentation

## 2019-12-13 DIAGNOSIS — W540XXA Bitten by dog, initial encounter: Secondary | ICD-10-CM | POA: Insufficient documentation

## 2019-12-13 DIAGNOSIS — Y929 Unspecified place or not applicable: Secondary | ICD-10-CM | POA: Insufficient documentation

## 2019-12-13 DIAGNOSIS — F17200 Nicotine dependence, unspecified, uncomplicated: Secondary | ICD-10-CM | POA: Insufficient documentation

## 2019-12-13 DIAGNOSIS — Y9389 Activity, other specified: Secondary | ICD-10-CM | POA: Insufficient documentation

## 2019-12-13 DIAGNOSIS — Z2914 Encounter for prophylactic rabies immune globin: Secondary | ICD-10-CM | POA: Insufficient documentation

## 2019-12-13 DIAGNOSIS — Z203 Contact with and (suspected) exposure to rabies: Secondary | ICD-10-CM | POA: Insufficient documentation

## 2019-12-13 MED ORDER — TETANUS-DIPHTH-ACELL PERTUSSIS 5-2.5-18.5 LF-MCG/0.5 IM SUSP
0.5000 mL | Freq: Once | INTRAMUSCULAR | Status: AC
  Administered 2019-12-13: 0.5 mL via INTRAMUSCULAR
  Filled 2019-12-13: qty 0.5

## 2019-12-13 MED ORDER — NAPROXEN 500 MG PO TABS: 500.0000 mg | ORAL_TABLET | Freq: Two times a day (BID) | ORAL | Status: AC

## 2019-12-13 MED ORDER — AMOXICILLIN-POT CLAVULANATE 875-125 MG PO TABS
1.0000 | ORAL_TABLET | Freq: Once | ORAL | Status: AC
  Administered 2019-12-13: 1 via ORAL
  Filled 2019-12-13: qty 1

## 2019-12-13 MED ORDER — BACITRACIN-NEOMYCIN-POLYMYXIN 400-5-5000 EX OINT
TOPICAL_OINTMENT | Freq: Once | CUTANEOUS | Status: AC
  Filled 2019-12-13: qty 1

## 2019-12-13 MED ORDER — RABIES VACCINE, PCEC IM SUSR
1.0000 mL | Freq: Once | INTRAMUSCULAR | Status: AC
  Administered 2019-12-13: 1 mL via INTRAMUSCULAR
  Filled 2019-12-13: qty 1

## 2019-12-13 MED ORDER — TRAMADOL HCL 50 MG PO TABS: 50.0000 mg | ORAL_TABLET | Freq: Four times a day (QID) | ORAL | 0 refills | Status: AC | PRN

## 2019-12-13 MED ORDER — RABIES IMMUNE GLOBULIN 150 UNIT/ML IM INJ
1500.0000 [IU] | INJECTION | Freq: Once | INTRAMUSCULAR | Status: AC
  Administered 2019-12-13: 1500 [IU] via INTRAMUSCULAR
  Filled 2019-12-13: qty 10

## 2019-12-13 MED ORDER — RABIES IMMUNE GLOBULIN 150 UNIT/ML IM INJ
225.0000 [IU] | INJECTION | Freq: Once | INTRAMUSCULAR | Status: DC
  Filled 2019-12-13: qty 2

## 2019-12-13 MED ORDER — RABIES IMMUNE GLOBULIN 150 UNIT/ML IM INJ: 20.0000 [IU]/kg | INJECTION | Freq: Once | INTRAMUSCULAR | Status: DC

## 2019-12-13 NOTE — ED Triage Notes (Addendum)
Dog bite to right fore arm last night at around 2300.  Patient states he does not know the dog or who the owners are.  Patient does not know where dog is now.    Dog bite occurred on patient's property, [address], [place], [place].  Puncture wounds to right fore arm.  Animal Control Notified

## 2019-12-13 NOTE — Discharge Instructions (Signed)
Follow-up with scheduled rabies injections.

## 2019-12-13 NOTE — ED Notes (Signed)
See triage note  Presents s/p dog bite  States he was at his family's house and 2 dogs were fighting  Then her was bitten

## 2019-12-13 NOTE — ED Provider Notes (Signed)
[facility] Emergency Department Provider Note   ____________________________________________   First MD Initiated Contact with Patient 12/13/19 1120     (approximate)  I have reviewed the triage vital signs and the nursing notes.   HISTORY  Chief Complaint Animal Bite    HPI Alexander Reid. is a 49 y.o. male patient presents with dog bite to the right forearm which occurred yesterday.  Patient tried to separate 2 dogs that were fighting.  1 dog bit the patient and ran off.  Animal control has  been notified.  Patient state awake and is wanting to increase edema and erythema to the right forearm.  Patient tetanus status not up-to-date.  Patient rates pain as 8/10.  Patient described the pain as "aching".  No palliative measure prior to arrival History reviewed. No pertinent past medical history.  There are no problems to display for this patient.   History reviewed. No pertinent surgical history.  Prior to Admission medications   Medication Sig Start Date End Date Taking? Authorizing Provider  naproxen (NAPROSYN) 500 MG tablet Take 1 tablet (500 mg total) by mouth 2 (two) times daily with a meal. 12/13/19   Joni Reining, PA-C  traMADol (ULTRAM) 50 MG tablet Take 1 tablet (50 mg total) by mouth every 6 (six) hours as needed. 12/13/19 12/12/20  Joni Reining, PA-C    Allergies Patient has no known allergies.  No family history on file.  Social History Social History   Tobacco Use  . Smoking status: Current Every Day Smoker  . Smokeless tobacco: Never Used  Substance Use Topics  . Alcohol use: No  . Drug use: No    Review of Systems Constitutional: No fever/chills Eyes: No visual changes. ENT: No sore throat. Cardiovascular: Denies chest pain. Respiratory: Denies shortness of breath. Gastrointestinal: No abdominal pain.  No nausea, no vomiting.  No diarrhea.  No constipation. Genitourinary: Negative for dysuria. Musculoskeletal:  Negative for back pain. Skin: Dog bite to right forearm Neurological: Negative for headaches, focal weakness or numbness.   ____________________________________________   PHYSICAL EXAM:  VITAL SIGNS: ED Triage Vitals  Enc Vitals Group     BP 12/13/19 1122 108/69     Pulse Rate 12/13/19 1122 81     Resp 12/13/19 1122 16     Temp 12/13/19 1122 97.9 F (36.6 C)     Temp Source 12/13/19 1122 Oral     SpO2 12/13/19 1122 97 %     Weight 12/13/19 1120 188 lb 15 oz (85.7 kg)     Height 12/13/19 1120 5\' 5"  (1.651 m)     Head Circumference --      Peak Flow --      Pain Score 12/13/19 1120 8     Pain Loc --      Pain Edu? --      Excl. in GC? --    Constitutional: Alert and oriented. Well appearing and in no acute distress. Cardiovascular: Normal rate, regular rhythm. Grossly normal heart sounds.  Good peripheral circulation. Respiratory: Normal respiratory effort.  No retractions. Lungs CTAB. Neurologic:  Normal speech and language. No gross focal neurologic deficits are appreciated. No gait instability. Skin: Patient has multiple puncture wounds to the right forearm. Psychiatric: Mood and affect are normal. Speech and behavior are normal.  ____________________________________________   LABS (all labs ordered are listed, but only abnormal results are displayed)  Labs Reviewed - No data to display ____________________________________________  EKG   ____________________________________________  RADIOLOGY  ED MD interpretation:    Official radiology report(s): No results found.  ____________________________________________   PROCEDURES  Procedure(s) performed (including Critical Care):  Procedures   ____________________________________________   INITIAL IMPRESSION / ASSESSMENT AND PLAN / ED COURSE  As part of my medical decision making, I reviewed the following data within the [place]     Patient presents with dog bite to the right  forearm which occurred yesterday.  Wounds are clean.  Patient of given initial rabies injections today.  Patient to follow-up per CDC recommendation for scheduled injections.  Patient also given a prescription for Augmentin, naproxen, and tramadol.    Alexander Author Hatlestad. was evaluated in Emergency Department on 12/13/2019 for the symptoms described in the history of present illness. He was evaluated in the context of the global COVID-19 pandemic, which necessitated consideration that the patient might be at risk for infection with the SARS-CoV-2 virus that causes COVID-19. Institutional protocols and algorithms that pertain to the evaluation of patients at risk for COVID-19 are in a state of rapid change based on information released by regulatory bodies including the CDC and federal and state organizations. These policies and algorithms were followed during the patient's care in the ED.       ____________________________________________   FINAL CLINICAL IMPRESSION(S) / ED DIAGNOSES  Final diagnoses:  Dog bite, initial encounter     ED Discharge Orders         Ordered    naproxen (NAPROSYN) 500 MG tablet  2 times daily with meals     12/13/19 1145    traMADol (ULTRAM) 50 MG tablet  Every 6 hours PRN     12/13/19 1145           Note:  This document was prepared using Dragon voice recognition software and may include unintentional dictation errors.    Sable Feil, PA-C 12/13/19 1158    Harvest Dark, MD 12/13/19 1349

## 2020-05-02 ENCOUNTER — Ambulatory Visit: Payer: Self-pay | Attending: Internal Medicine

## 2020-05-02 DIAGNOSIS — Z23 Encounter for immunization: Secondary | ICD-10-CM

## 2020-05-02 NOTE — Progress Notes (Signed)
   [facility]  Name:  Alexander Reid.    MRN: [id number] DOB: 1970/12/28  05/02/2020  [name] was observed post Covid-19 immunization for 15 minutes without incident. He was provided with Vaccine Information Sheet and instruction to access the V-Safe system.   [name] was instructed to call 911 with any severe reactions post vaccine: Marland Kitchen Difficulty breathing  . Swelling of face and throat  . A fast heartbeat  . A bad rash all over body  . Dizziness and weakness   Immunizations Administered    Name Date Dose VIS Date Route   Pfizer COVID-19 Vaccine 05/02/2020 10:55 AM 0.3 mL 10/27/2018 Intramuscular   Manufacturer: [employer], [employer]   Lot: [id number]   NDC: [address]-1

## 2020-05-23 ENCOUNTER — Ambulatory Visit: Payer: Self-pay
